# Patient Record
Sex: Female | Born: 1965 | Race: White | Hispanic: No | Marital: Married | Smoking: Never smoker
Health system: Southern US, Community
[De-identification: ages and names within clinical notes are randomized; demographics above are authoritative.]

---

## 2018-03-28 ENCOUNTER — Emergency Department (HOSPITAL_COMMUNITY)
Admission: EM | Admit: 2018-03-28 | Discharge: 2018-03-28 | Disposition: A | Discharge status: Home / Self Care | Payer: BLUE CROSS/BLUE SHIELD | Attending: Emergency Medicine | Admitting: Emergency Medicine

## 2018-03-28 ENCOUNTER — Emergency Department (HOSPITAL_COMMUNITY): Payer: BLUE CROSS/BLUE SHIELD

## 2018-03-28 ENCOUNTER — Encounter (HOSPITAL_COMMUNITY): Payer: Self-pay

## 2018-03-28 ENCOUNTER — Other Ambulatory Visit: Payer: Self-pay

## 2018-03-28 DIAGNOSIS — S0990XA Unspecified injury of head, initial encounter: Secondary | ICD-10-CM | POA: Diagnosis present

## 2018-03-28 DIAGNOSIS — Y999 Unspecified external cause status: Secondary | ICD-10-CM | POA: Diagnosis not present

## 2018-03-28 DIAGNOSIS — Y929 Unspecified place or not applicable: Secondary | ICD-10-CM | POA: Insufficient documentation

## 2018-03-28 DIAGNOSIS — S060X0A Concussion without loss of consciousness, initial encounter: Secondary | ICD-10-CM | POA: Diagnosis not present

## 2018-03-28 DIAGNOSIS — R4182 Altered mental status, unspecified: Secondary | ICD-10-CM

## 2018-03-28 DIAGNOSIS — Y939 Activity, unspecified: Secondary | ICD-10-CM | POA: Diagnosis not present

## 2018-03-28 LAB — COMPREHENSIVE METABOLIC PANEL
ALK PHOS: 56 U/L (ref 38–126)
ALT: 21 U/L (ref 14–54)
ANION GAP: 9 (ref 5–15)
AST: 18 U/L (ref 15–41)
Albumin: 3.8 g/dL (ref 3.5–5.0)
BUN: 13 mg/dL (ref 6–20)
CALCIUM: 8.8 mg/dL — AB (ref 8.9–10.3)
CO2: 25 mmol/L (ref 22–32)
CREATININE: 0.78 mg/dL (ref 0.44–1.00)
Chloride: 105 mmol/L (ref 101–111)
Glucose, Bld: 129 mg/dL — ABNORMAL HIGH (ref 65–99)
Potassium: 3.4 mmol/L — ABNORMAL LOW (ref 3.5–5.1)
SODIUM: 139 mmol/L (ref 135–145)
Total Bilirubin: 0.4 mg/dL (ref 0.3–1.2)
Total Protein: 6.2 g/dL — ABNORMAL LOW (ref 6.5–8.1)

## 2018-03-28 LAB — I-STAT CHEM 8, ED
BUN: 15 mg/dL (ref 6–20)
CALCIUM ION: 1.1 mmol/L — AB (ref 1.15–1.40)
CHLORIDE: 103 mmol/L (ref 101–111)
CREATININE: 0.7 mg/dL (ref 0.44–1.00)
GLUCOSE: 128 mg/dL — AB (ref 65–99)
HCT: 41 % (ref 36.0–46.0)
Hemoglobin: 13.9 g/dL (ref 12.0–15.0)
POTASSIUM: 3.3 mmol/L — AB (ref 3.5–5.1)
Sodium: 139 mmol/L (ref 135–145)
TCO2: 26 mmol/L (ref 22–32)

## 2018-03-28 LAB — CBC
HCT: 40.8 % (ref 36.0–46.0)
HEMOGLOBIN: 13.3 g/dL (ref 12.0–15.0)
MCH: 30.2 pg (ref 26.0–34.0)
MCHC: 32.6 g/dL (ref 30.0–36.0)
MCV: 92.5 fL (ref 78.0–100.0)
PLATELETS: 237 10*3/uL (ref 150–400)
RBC: 4.41 MIL/uL (ref 3.87–5.11)
RDW: 13.3 % (ref 11.5–15.5)
WBC: 8.5 10*3/uL (ref 4.0–10.5)

## 2018-03-28 LAB — SAMPLE TO BLOOD BANK

## 2018-03-28 LAB — PROTIME-INR
INR: 0.97
PROTHROMBIN TIME: 12.8 s (ref 11.4–15.2)

## 2018-03-28 LAB — ETHANOL: Alcohol, Ethyl (B): <10 mg/dL (ref <10)

## 2018-03-28 LAB — I-STAT CG4 LACTIC ACID, ED: Lactic Acid, Venous: 1.65 mmol/L (ref 0.5–1.9)

## 2018-03-28 MED ORDER — ONDANSETRON HCL 4 MG/2ML IJ SOLN
4.0000 mg | Freq: Once | INTRAMUSCULAR | Status: AC
  Administered 2018-03-28: 4 mg via INTRAVENOUS
  Filled 2018-03-28: qty 2

## 2018-03-28 MED ORDER — ACETAMINOPHEN 500 MG PO TABS
1000.0000 mg | ORAL_TABLET | Freq: Once | ORAL | Status: AC
  Administered 2018-03-28: 1000 mg via ORAL
  Filled 2018-03-28: qty 2

## 2018-03-28 NOTE — ED Notes (Signed)
Pt advised she could not give urine sample yet.
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
## 2018-03-28 NOTE — ED Notes (Signed)
Pt asking for someone by the name of heather

## 2018-03-28 NOTE — ED Notes (Signed)
The pt is asking for heather that's her daughter she is also a pt but in the peds dept
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
## 2018-03-28 NOTE — ED Notes (Signed)
The pt is sl confused with repetitive questions  Her daughter who was a pt in peds has just been released and is at the bedside with an uncle

## 2018-03-28 NOTE — ED Notes (Signed)
Pt in xray

## 2018-03-28 NOTE — ED Notes (Signed)
Pt very lethargic and unable to answer all triage questions, MD brought bedside for evaluation.
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
## 2018-03-28 NOTE — ED Notes (Signed)
Pt and family understood dc material. NAD noted. Blue scrub top given at dc

## 2018-03-29 LAB — CDS SEROLOGY
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
